# Patient Record
Sex: Male | Born: 1980 | Race: White | Hispanic: No | Marital: Married | Smoking: Never smoker
Health system: Southern US, Community
[De-identification: ages and names within clinical notes are randomized; demographics above are authoritative.]

---

## 2002-01-11 ENCOUNTER — Emergency Department (HOSPITAL_COMMUNITY): Admission: EM | Admit: 2002-01-11 | Discharge: 2002-01-11 | Payer: Self-pay | Admitting: Physical Therapy

## 2017-06-15 ENCOUNTER — Emergency Department (HOSPITAL_COMMUNITY): Payer: Managed Care, Other (non HMO)

## 2017-06-15 ENCOUNTER — Emergency Department (HOSPITAL_COMMUNITY)
Admission: EM | Admit: 2017-06-15 | Discharge: 2017-06-15 | Disposition: A | Discharge status: Home / Self Care | Payer: Managed Care, Other (non HMO) | Attending: Emergency Medicine | Admitting: Emergency Medicine

## 2017-06-15 ENCOUNTER — Other Ambulatory Visit: Payer: Self-pay

## 2017-06-15 ENCOUNTER — Encounter (HOSPITAL_COMMUNITY): Payer: Self-pay | Admitting: Emergency Medicine

## 2017-06-15 DIAGNOSIS — Y9389 Activity, other specified: Secondary | ICD-10-CM | POA: Insufficient documentation

## 2017-06-15 DIAGNOSIS — Y999 Unspecified external cause status: Secondary | ICD-10-CM | POA: Insufficient documentation

## 2017-06-15 DIAGNOSIS — R071 Chest pain on breathing: Secondary | ICD-10-CM | POA: Insufficient documentation

## 2017-06-15 DIAGNOSIS — Z23 Encounter for immunization: Secondary | ICD-10-CM | POA: Diagnosis not present

## 2017-06-15 DIAGNOSIS — Y92410 Unspecified street and highway as the place of occurrence of the external cause: Secondary | ICD-10-CM | POA: Diagnosis not present

## 2017-06-15 DIAGNOSIS — S0990XA Unspecified injury of head, initial encounter: Secondary | ICD-10-CM | POA: Insufficient documentation

## 2017-06-15 DIAGNOSIS — M25552 Pain in left hip: Secondary | ICD-10-CM | POA: Insufficient documentation

## 2017-06-15 DIAGNOSIS — R0781 Pleurodynia: Secondary | ICD-10-CM

## 2017-06-15 MED ORDER — IOPAMIDOL (ISOVUE-300) INJECTION 61%
INTRAVENOUS | Status: AC
  Administered 2017-06-15: 100 mL
  Filled 2017-06-15: qty 100

## 2017-06-15 MED ORDER — NAPROXEN 375 MG PO TABS: 375.0000 mg | ORAL_TABLET | Freq: Two times a day (BID) | ORAL | 0 refills | Status: DC

## 2017-06-15 MED ORDER — BACITRACIN ZINC 500 UNIT/GM EX OINT
TOPICAL_OINTMENT | Freq: Two times a day (BID) | CUTANEOUS | Status: DC
  Administered 2017-06-15: 1 via TOPICAL

## 2017-06-15 MED ORDER — TETANUS-DIPHTH-ACELL PERTUSSIS 5-2.5-18.5 LF-MCG/0.5 IM SUSP
0.5000 mL | Freq: Once | INTRAMUSCULAR | Status: AC
  Administered 2017-06-15: 0.5 mL via INTRAMUSCULAR
  Filled 2017-06-15: qty 0.5

## 2017-06-15 MED ORDER — CYCLOBENZAPRINE HCL 10 MG PO TABS: 10.0000 mg | ORAL_TABLET | Freq: Three times a day (TID) | ORAL | 0 refills | Status: AC | PRN
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
## 2017-06-15 NOTE — ED Notes (Signed)
Pt ambulated well in hall sats 95%-100%
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
## 2017-06-25 ENCOUNTER — Ambulatory Visit: Payer: Self-pay | Admitting: Family Medicine
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
## 2017-06-29 ENCOUNTER — Ambulatory Visit (INDEPENDENT_AMBULATORY_CARE_PROVIDER_SITE_OTHER): Payer: Managed Care, Other (non HMO) | Admitting: Family Medicine

## 2017-06-29 ENCOUNTER — Ambulatory Visit (INDEPENDENT_AMBULATORY_CARE_PROVIDER_SITE_OTHER)
Admission: RE | Admit: 2017-06-29 | Discharge: 2017-06-29 | Disposition: A | Discharge status: Home / Self Care | Payer: Managed Care, Other (non HMO) | Source: Ambulatory Visit | Attending: Family Medicine | Admitting: Family Medicine

## 2017-06-29 VITALS — BP 122/82 | HR 69 | Ht 72.0 in | Wt 165.0 lb

## 2017-06-29 DIAGNOSIS — M79605 Pain in left leg: Secondary | ICD-10-CM

## 2017-06-29 DIAGNOSIS — M25552 Pain in left hip: Secondary | ICD-10-CM

## 2017-06-29 DIAGNOSIS — M545 Low back pain: Secondary | ICD-10-CM | POA: Diagnosis not present

## 2017-06-29 DIAGNOSIS — M25551 Pain in right hip: Secondary | ICD-10-CM

## 2017-06-29 MED ORDER — PREDNISONE 50 MG PO TABS: 50.0000 mg | ORAL_TABLET | Freq: Every day | ORAL | 0 refills | Status: DC

## 2017-06-29 MED ORDER — GABAPENTIN 100 MG PO CAPS: 200.0000 mg | ORAL_CAPSULE | Freq: Every day | ORAL | 3 refills | Status: AC
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
## 2017-06-29 NOTE — Patient Instructions (Signed)
Good to see you  Ice 20 minutes 2 times daily. Usually after activity and before bed. Exercises 3 times a week.  Gabapentin 200mg  at night  Prednisone daily for 5 days  Xrays downstairs See me again in 2 weeks to make sure you are doing well
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
## 2017-07-14 ENCOUNTER — Encounter: Payer: Self-pay | Admitting: Family Medicine

## 2017-07-14 ENCOUNTER — Ambulatory Visit (INDEPENDENT_AMBULATORY_CARE_PROVIDER_SITE_OTHER): Payer: Managed Care, Other (non HMO) | Admitting: Family Medicine

## 2017-07-14 VITALS — BP 110/78 | HR 60 | Ht 72.0 in | Wt 164.6 lb

## 2017-07-14 DIAGNOSIS — M5416 Radiculopathy, lumbar region: Secondary | ICD-10-CM | POA: Diagnosis not present

## 2017-07-14 DIAGNOSIS — M79604 Pain in right leg: Secondary | ICD-10-CM

## 2017-07-14 DIAGNOSIS — M545 Low back pain: Secondary | ICD-10-CM | POA: Diagnosis not present
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
## 2017-07-14 NOTE — Patient Instructions (Signed)
Good to see you  Micheal Murillo is your friend.  Continue everything at this time.  We will get MRI  If they do not call yo in 2 days call them [phone] and schedule yourself.  I will write you when I get the results

## 2017-07-20 ENCOUNTER — Ambulatory Visit
Admission: RE | Admit: 2017-07-20 | Discharge: 2017-07-20 | Disposition: A | Discharge status: Home / Self Care | Payer: Managed Care, Other (non HMO) | Source: Ambulatory Visit | Attending: Family Medicine | Admitting: Family Medicine

## 2017-07-20 ENCOUNTER — Other Ambulatory Visit: Payer: Managed Care, Other (non HMO)

## 2017-07-20 DIAGNOSIS — M5416 Radiculopathy, lumbar region: Secondary | ICD-10-CM

## 2017-07-27 ENCOUNTER — Other Ambulatory Visit: Payer: Self-pay | Admitting: *Deleted

## 2017-07-27 DIAGNOSIS — M545 Low back pain, unspecified: Secondary | ICD-10-CM

## 2017-08-04 ENCOUNTER — Ambulatory Visit
Admission: RE | Admit: 2017-08-04 | Discharge: 2017-08-04 | Disposition: A | Discharge status: Home / Self Care | Payer: Managed Care, Other (non HMO) | Source: Ambulatory Visit | Attending: Family Medicine | Admitting: Family Medicine

## 2017-08-04 DIAGNOSIS — M79604 Pain in right leg: Secondary | ICD-10-CM

## 2017-08-04 DIAGNOSIS — M545 Low back pain: Secondary | ICD-10-CM

## 2017-08-04 MED ORDER — IOPAMIDOL (ISOVUE-M 200) INJECTION 41%
1.0000 mL | Freq: Once | INTRAMUSCULAR | Status: AC
  Administered 2017-08-04: 1 mL via EPIDURAL

## 2017-08-04 MED ORDER — METHYLPREDNISOLONE ACETATE 40 MG/ML INJ SUSP (RADIOLOG
120.0000 mg | Freq: Once | INTRAMUSCULAR | Status: AC
  Administered 2017-08-04: 120 mg via EPIDURAL

## 2017-08-04 NOTE — Discharge Instructions (Signed)
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
## 2018-06-23 ENCOUNTER — Ambulatory Visit (INDEPENDENT_AMBULATORY_CARE_PROVIDER_SITE_OTHER): Payer: Managed Care, Other (non HMO) | Admitting: Family Medicine

## 2018-06-23 ENCOUNTER — Encounter: Payer: Self-pay | Admitting: Family Medicine

## 2018-06-23 VITALS — BP 136/78 | HR 61 | Ht 72.0 in | Wt 175.0 lb

## 2018-06-23 DIAGNOSIS — M545 Low back pain, unspecified: Secondary | ICD-10-CM

## 2018-06-23 DIAGNOSIS — M79604 Pain in right leg: Secondary | ICD-10-CM
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
## 2018-06-23 NOTE — Patient Instructions (Addendum)
Good to see you  Lets do another epidural we will put in an order for another one When in pain continue the ibuprofen 3 pills 3 times a day for 3 days  .zice If you get the epidural then please make a web-ex visit with me 2-3 weeks after I am here if you have questoins

## 2018-09-02 ENCOUNTER — Telehealth: Payer: Self-pay | Admitting: Family Medicine
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
## 2018-09-02 NOTE — Telephone Encounter (Signed)
I am fine with writing a letter stating that he is at maximal medical improvement and should do well but could have possible flares lasting 2-3 days in length where he would not be able to work up to 2 years.

## 2018-09-06 NOTE — Telephone Encounter (Signed)
Letter e-mailed to patient.
